# Patient Record
Sex: Male | Born: 1995 | Hispanic: Yes | Marital: Single | Smoking: Never smoker
Health system: Southern US, Community
[De-identification: ages and names within clinical notes are randomized; demographics above are authoritative.]

## PROBLEM LIST (undated history)

## (undated) DIAGNOSIS — J45909 Unspecified asthma, uncomplicated: Secondary | ICD-10-CM

---
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
## 2016-01-06 ENCOUNTER — Encounter: Payer: Self-pay | Admitting: Emergency Medicine

## 2016-01-06 ENCOUNTER — Emergency Department
Admission: EM | Admit: 2016-01-06 | Discharge: 2016-01-06 | Disposition: A | Discharge status: Home / Self Care | Payer: No Typology Code available for payment source | Attending: Emergency Medicine | Admitting: Emergency Medicine

## 2016-01-06 DIAGNOSIS — T310 Burns involving less than 10% of body surface: Secondary | ICD-10-CM | POA: Insufficient documentation

## 2016-01-06 DIAGNOSIS — Y990 Civilian activity done for income or pay: Secondary | ICD-10-CM | POA: Insufficient documentation

## 2016-01-06 DIAGNOSIS — Y929 Unspecified place or not applicable: Secondary | ICD-10-CM | POA: Diagnosis not present

## 2016-01-06 DIAGNOSIS — Y939 Activity, unspecified: Secondary | ICD-10-CM | POA: Diagnosis not present

## 2016-01-06 DIAGNOSIS — X19XXXA Contact with other heat and hot substances, initial encounter: Secondary | ICD-10-CM | POA: Insufficient documentation

## 2016-01-06 DIAGNOSIS — T22211A Burn of second degree of right forearm, initial encounter: Secondary | ICD-10-CM | POA: Insufficient documentation

## 2016-01-06 MED ORDER — CEPHALEXIN 500 MG PO CAPS: 500.0000 mg | ORAL_CAPSULE | Freq: Three times a day (TID) | ORAL | Status: DC

## 2016-01-06 MED ORDER — SILVER SULFADIAZINE 1 % EX CREA
TOPICAL_CREAM | Freq: Once | CUTANEOUS | Status: AC
  Administered 2016-01-06: 10:00:00 via TOPICAL

## 2016-01-06 MED ORDER — SILVER SULFADIAZINE 1 % EX CREA: TOPICAL_CREAM | CUTANEOUS | Status: DC

## 2016-01-06 NOTE — ED Notes (Signed)
Right forearm area cleansed with saline   SSD cream and adaptic /confrom dressing applied

## 2016-01-06 NOTE — ED Notes (Signed)
States he received a burn ro right wrist area 2 days ago ..developed blistered area yesterday and now has some swelling and blister "popped" this am

## 2016-01-06 NOTE — ED Notes (Signed)
Burn to right arm from hot pan yesterday while at work at [employer].

## 2016-01-06 NOTE — Discharge Instructions (Signed)
Burn Care °Your skin is a natural barrier to infection. It is the largest organ of your body. Burns damage this natural protection. To help prevent infection, it is very important to follow your caregiver's instructions in the care of your burn. °Burns are classified as: °· First degree. There is only redness of the skin (erythema). No scarring is expected. °· Second degree. There is blistering of the skin. Scarring may occur with deeper burns. °· Third degree. All layers of the skin are injured, and scarring is expected. °HOME CARE INSTRUCTIONS  °· Wash your hands well before changing your bandage. °· Change your bandage as often as directed by your caregiver. °· Remove the old bandage. If the bandage sticks, you may soak it off with cool, clean water. °· Cleanse the burn thoroughly but gently with mild soap and water. °· Pat the area dry with a clean, dry cloth. °· Apply a thin layer of antibacterial cream to the burn. °· Apply a clean bandage as instructed by your caregiver. °· Keep the bandage as clean and dry as possible. °· Elevate the affected area for the first 24 hours, then as instructed by your caregiver. °· Only take over-the-counter or prescription medicines for pain, discomfort, or fever as directed by your caregiver. °SEEK IMMEDIATE MEDICAL CARE IF:  °· You develop excessive pain. °· You develop redness, tenderness, swelling, or red streaks near the burn. °· The burned area develops yellowish-white fluid (pus) or a bad smell. °· You have a fever. °MAKE SURE YOU:  °· Understand these instructions. °· Will watch your condition. °· Will get help right away if you are not doing well or get worse. °  °This information is not intended to replace advice given to you by your health care provider. Make sure you discuss any questions you have with your health care provider. °  °Document Released: 10/05/2005 Document Revised: 12/28/2011 Document Reviewed: 02/25/2011 °Elsevier Interactive Patient Education ©2016  [employer] ° °Second-Degree Burn °A second-degree burn affects the 2 outer layers of skin. The outer layer (epidermis) and the layer underneath it (dermis) are both burned. Another name for this type of burn is a partial thickness burn. A second-degree burn may be called minor or major. This depends on the size of the burn. It also depends on what parts of the skin are burned. Minor burns may be treated with first aid. Major burns are a medical emergency. °A second-degree burn is worse than a first-degree burn, but not as bad as a third-degree burn. A first-degree burn affects only the epidermis. A third-degree burn goes through all the layers of skin. A second-degree burn usually heals in 3 to 4 weeks. A minor second-degree burn usually does not leave a scar. Deeper second-degree burns may lead to scarring of the skin or contractures over joints. Contractures are scars that form over joints and may lead to reduced mobility at those joints. °CAUSES °· Heat (thermal) injury. This happens when skin comes in contact with something very hot. It could be a flame, a hot object, hot liquid, or steam. Most second-degree burns are thermal injuries. °· Radiation. Sunlight is one type of radiation that can burn the skin. Another type of radiation is used to heat food. Radiation is also used to treat some diseases, such as cancer. All types of radiation can burn the skin. Sunlight usually causes a first-degree burn. Radiation used for heating food or treating a disease can cause a second-degree burn. °· Electricity. Electrical burns can cause more   damage under the skin than on the surface. They should always be treated as major burns. °· Chemicals. Many chemicals can burn the skin. The burn should be flushed with cool water and checked by an emergency caregiver. °SYMPTOMS °Symptoms of second-degree burns include: °· Severe pain. °· Extreme tenderness. °· Deep redness. °· Blistered skin. °· Skin that has changed color. It  might look blotchy, wet, or shiny. °· Swelling. °TREATMENT °Some second-degree burns may need to be treated in a hospital. These include major burns, electrical burns, and chemical burns. Many other second-degree burns can be treated with regular first aid, such as: °· Cooling the burn. Use cool, germ-free (sterile) salt water. Place the burned area of skin into a tub of water, or cover the burned area with clean, wet towels. °· Taking pain medicine. °· Removing the dead skin from broken blisters. A trained caregiver may do this. Do not pop blisters. °· Gently washing your skin with mild soap. °· Covering the burned area with a cream. Silver sulfadiazine is a cream for burns. An antibiotic cream, such as bacitracin, may also be used to fight infection. Do not use other ointments or creams unless your caregiver says it is okay. °· Protecting the burn with a sterile, non-sticky bandage. °· Bandaging fingers and toes separately. This keeps them from sticking together. °· Taking an antibiotic. This can help prevent infection. °· Getting a tetanus shot. °HOME CARE INSTRUCTIONS °Medication °· Take any medicine prescribed by your caregiver. Follow the directions carefully. °· Ask your caregiver if you can take over-the-counter medicine to relieve pain and swelling. Do not give aspirin to children. °· Make sure your caregiver knows about all other medicines you take. This includes over-the-counter medicines. °Burn care °· You will need to change the bandage on your burn. You may need to do this 2 or 3 times each day. °¨ Gently clean the burned area. °¨ Put ointment on it. °¨ Cover the burn with a sterile bandage. °· For some deeper burns or burns that cover a large area, compression garments may be prescribed. These garments can help minimize scarring and protect your mobility. °· Do not put butter or oil on your skin. Use only the cream prescribed by your caregiver. °· Do not put ice on your burn. °· Do not break blisters  on your skin. °· Keep the bandaged area dry. You might need to take a sponge bath for awhile. Ask your caregiver when you can take a shower or a tub bath again. °· Do not scratch an itchy burn. Your caregiver may give you medicine to relieve very bad itching. °· Infection is a big danger after a second-degree burn. Tell your caregiver right away if you have signs of infection, such as: °¨ Redness or changing color in the burned area. °¨ Fluid leaking from the burn. °¨ Swelling in the burn area. °¨ A bad smell coming from the wound. °Follow-up °· Keep all follow-up appointments. This is important. This is how your caregiver can tell if your treatment is working. °· Protect your burn from sunlight. Use sunscreen whenever you go outside. Burned areas may be sensitive to the sun for up to 1 year. Exposure to the sun may also cause permanent darkening of scars. °SEEK MEDICAL CARE IF: °· You have any questions about medicines. °· You have any questions about your treatment. °· You wonder if it is okay to do a particular activity. °· You develop a fever of more than 100.5° F (38.1° C). °SEEK IMMEDIATE MEDICAL CARE IF: °·   You think your burn might be infected. It may change color, become red, leak fluid, swell, or smell bad. °· You develop a fever of more than 102° F (38.9° C). °  °This information is not intended to replace advice given to you by your health care provider. Make sure you discuss any questions you have with your health care provider. °  °Document Released: 03/09/2011 Document Revised: 12/28/2011 Document Reviewed: 03/09/2011 °Elsevier Interactive Patient Education ©2016 Elsevier Inc. ° °

## 2016-01-06 NOTE — ED Provider Notes (Signed)
[facility] Emergency Department Provider Note  ____________________________________________  Time seen: Approximately 9:43 AM  I have reviewed the triage vital signs and the nursing notes.   HISTORY  Chief Complaint Burn  Patient is fluent in English  HPI Bobby Mendoza is a 20 y.o. male , NAD, presents to the emergency department with 2 day history of burn to the right forearm. States he was burned by hot pan while working at [employer]. They applied burn cream and wrap the area and gauze at the time of injury. Patient has not changed the bandages since that time but has noticed that the blister around the area has popped. Denies any fever, chills, body aches. Has not had any oozing or weeping from the site. Area is painful to manipulation. Denies any numbness, weakness, tingling of the right upper extremity. Has not had any other injuries.   History reviewed. No pertinent past medical history.  There are no active problems to display for this patient.   History reviewed. No pertinent past surgical history.  Current Outpatient Rx  Name  Route  Sig  Dispense  Refill  . cephALEXin (KEFLEX) 500 MG capsule   Oral   Take 1 capsule (500 mg total) by mouth 3 (three) times daily.   21 capsule   0   . silver sulfADIAZINE (SILVADENE) 1 % cream      Apply to affected twice daily with bandage change.   50 g   1     Allergies Review of patient's allergies indicates no known allergies.  No family history on file.  Social History Social History  Substance Use Topics  . Smoking status: Never Smoker   . Smokeless tobacco: None  . Alcohol Use: No     Review of Systems  Constitutional: No fever/chills, fatigue Cardiovascular: No chest pain. Respiratory: No shortness of breath. No wheezing.  Gastrointestinal: No abdominal pain.  No nausea, vomiting.   Musculoskeletal: Negative for right arm pain.  Skin: Positive burn to right forearm with blister.  Negative for rash, spreading erythema, oozing, weeping. Neurological: Negative for headaches, focal weakness or numbness. 10-point ROS otherwise negative.  ____________________________________________   PHYSICAL EXAM:  VITAL SIGNS: ED Triage Vitals  Enc Vitals Group     BP 01/06/16 0919 129/70 mmHg     Pulse Rate 01/06/16 0919 77     Resp 01/06/16 0919 20     Temp 01/06/16 0919 97.5 F (36.4 C)     Temp Source 01/06/16 0919 Oral     SpO2 01/06/16 0919 98 %     Weight 01/06/16 0919 205 lb (92.987 kg)     Height 01/06/16 0919  (1.702 m)     Head Cir --      Peak Flow --      Pain Score 01/06/16 0859 5     Pain Loc --      Pain Edu? --      Excl. in GC? --     Constitutional: Alert and oriented. Well appearing and in no acute distress. Eyes: Conjunctivae are normal.   Head: Atraumatic. Neck: Supple with full range of motion. Hematological/Lymphatic/Immunilogical: No cervical lymphadenopathy. Cardiovascular:  Good peripheral circulation with bilateral upper extremities with 2+ pulses. Respiratory: Normal respiratory effort without tachypnea or retractions.  Musculoskeletal: No pain to palpation about the right upper extremity except at the burn site on the forearm. Full range of motion of right shoulder, elbow, wrist, hand, fingers. No edema.  No joint effusions.  Neurologic:  Normal speech and language. No gross focal neurologic deficits are appreciated.  Skin:  2 cm oblong burn with broken blister noted about the posterior, proximal forearm. No oozing or weeping. No skin sores noted. No spreading erythema nor streaking. Skin is warm, dry.  Psychiatric: Mood and affect are normal. Speech and behavior are normal. Patient exhibits appropriate insight and judgement.   ____________________________________________    LABS  None  ____________________________________________  EKG  None ____________________________________________  RADIOLOGY  None ____________________________________________    PROCEDURES  Procedure(s) performed: None    Medications  silver sulfADIAZINE (SILVADENE) 1 % cream (not administered)     ____________________________________________   INITIAL IMPRESSION / ASSESSMENT AND PLAN / ED COURSE  Patient's diagnosis is consistent with second-degree burn of the right forearm. Patient will be discharged home with prescriptions for Silvadene cream to apply twice daily as directed. Patient also empirically covered for any potential infection with Keflex to take as directed. Patient may take over-the-counter Tylenol or ibuprofen as needed for pain. Patient requested a note to excuse from classes today and it was given. [facility] days for a wound recheck. Patient is given ED precautions to return to the ED for any worsening or new symptoms.    ____________________________________________  FINAL CLINICAL IMPRESSION(S) / ED DIAGNOSES  Final diagnoses:  Second degree burn of forearm, right, initial encounter      NEW MEDICATIONS STARTED DURING THIS VISIT:  New Prescriptions   CEPHALEXIN (KEFLEX) 500 MG CAPSULE    Take 1 capsule (500 mg total) by mouth 3 (three) times daily.   SILVER SULFADIAZINE (SILVADENE) 1 % CREAM    Apply to affected twice daily with bandage change.         Hope PigeonJami L Hagler, PA-C 01/06/16 [id number]  Governor Rooksebecca Lord, MD 01/06/16 1017

## 2018-05-02 ENCOUNTER — Emergency Department
Admission: EM | Admit: 2018-05-02 | Discharge: 2018-05-02 | Disposition: A | Discharge status: Home / Self Care | Payer: Self-pay | Attending: Emergency Medicine | Admitting: Emergency Medicine

## 2018-05-02 ENCOUNTER — Other Ambulatory Visit: Payer: Self-pay

## 2018-05-02 ENCOUNTER — Encounter: Payer: Self-pay | Admitting: Emergency Medicine

## 2018-05-02 DIAGNOSIS — K529 Noninfective gastroenteritis and colitis, unspecified: Secondary | ICD-10-CM | POA: Insufficient documentation

## 2018-05-02 LAB — URINALYSIS, COMPLETE (UACMP) WITH MICROSCOPIC
BACTERIA UA: NONE SEEN
Glucose, UA: NEGATIVE mg/dL
HGB URINE DIPSTICK: NEGATIVE
KETONES UR: NEGATIVE mg/dL
Leukocytes, UA: NEGATIVE
Nitrite: NEGATIVE
PROTEIN: NEGATIVE mg/dL
SQUAMOUS EPITHELIAL / LPF: NONE SEEN (ref 0–5)
Specific Gravity, Urine: 1.033 — ABNORMAL HIGH (ref 1.005–1.030)
pH: 5 (ref 5.0–8.0)

## 2018-05-02 LAB — CBC
HEMATOCRIT: 43.2 % (ref 40.0–52.0)
Hemoglobin: 15.1 g/dL (ref 13.0–18.0)
MCH: 29.9 pg (ref 26.0–34.0)
MCHC: 34.9 g/dL (ref 32.0–36.0)
MCV: 85.6 fL (ref 80.0–100.0)
Platelets: 241 10*3/uL (ref 150–440)
RBC: 5.04 MIL/uL (ref 4.40–5.90)
RDW: 12.7 % (ref 11.5–14.5)
WBC: 10 10*3/uL (ref 3.8–10.6)

## 2018-05-02 LAB — COMPREHENSIVE METABOLIC PANEL
ALT: 20 U/L (ref 0–44)
AST: 30 U/L (ref 15–41)
Albumin: 4.7 g/dL (ref 3.5–5.0)
Alkaline Phosphatase: 94 U/L (ref 38–126)
Anion gap: 11 (ref 5–15)
BUN: 14 mg/dL (ref 6–20)
CHLORIDE: 106 mmol/L (ref 98–111)
CO2: 22 mmol/L (ref 22–32)
Calcium: 9 mg/dL (ref 8.9–10.3)
Creatinine, Ser: 0.82 mg/dL (ref 0.61–1.24)
GFR calc Af Amer: >60 mL/min (ref >60)
Glucose, Bld: 94 mg/dL (ref 70–99)
POTASSIUM: 3.5 mmol/L (ref 3.5–5.1)
SODIUM: 139 mmol/L (ref 135–145)
Total Bilirubin: 0.7 mg/dL (ref 0.3–1.2)
Total Protein: 7.8 g/dL (ref 6.5–8.1)

## 2018-05-02 LAB — LIPASE, BLOOD: LIPASE: 27 U/L (ref 11–51)

## 2018-05-02 MED ORDER — LOPERAMIDE HCL 2 MG PO TABS: 2.0000 mg | ORAL_TABLET | Freq: Four times a day (QID) | ORAL | 0 refills | Status: DC | PRN

## 2018-05-02 MED ORDER — ONDANSETRON 4 MG PO TBDP: 4.0000 mg | ORAL_TABLET | Freq: Three times a day (TID) | ORAL | 0 refills | Status: DC | PRN

## 2018-05-02 NOTE — ED Provider Notes (Signed)
[facility] Emergency Department Provider Note   ____________________________________________    I have reviewed the triage vital signs and the nursing notes.   HISTORY  Chief Complaint Abdominal Pain and Diarrhea     HPI Bobby Mendoza is a 22 y.o. male who presents with complaints of diarrhea and nausea which started late last night.  Patient reports multiple episodes of watery diarrhea last night.  Some abdominal cramping as well.  Today he feels nauseated.  No recent travel.  No recent camping.  No recent medications.  No recent box.  No sick contacts reported.  Has not taken anything for this.  No fevers or chills   History reviewed. No pertinent past medical history.  There are no active problems to display for this patient.   History reviewed. No pertinent surgical history.  Prior to Admission medications   Medication Sig Start Date End Date Taking? Authorizing Provider  cephALEXin (KEFLEX) 500 MG capsule Take 1 capsule (500 mg total) by mouth 3 (three) times daily. 01/06/16   Hagler, Jami L, PA-C  loperamide (IMODIUM A-D) 2 MG tablet Take 1 tablet (2 mg total) by mouth 4 (four) times daily as needed for diarrhea or loose stools. 05/02/18   Jene Every, MD  ondansetron (ZOFRAN ODT) 4 MG disintegrating tablet Take 1 tablet (4 mg total) by mouth every 8 (eight) hours as needed for nausea or vomiting. 05/02/18   Jene Every, MD  silver sulfADIAZINE (SILVADENE) 1 % cream Apply to affected twice daily with bandage change. 01/06/16   Hagler, Jami L, PA-C     Allergies Patient has no known allergies.  No family history on file.  Social History Social History   Tobacco Use  . Smoking status: Never Smoker  . Smokeless tobacco: Never Used  Substance Use Topics  . Alcohol use: No  . Drug use: Not on file    Review of Systems  Constitutional: No fever/chills Eyes: No visual changes.  ENT: No sore throat. Cardiovascular: Denies chest  pain. Respiratory: Denies shortness of breath. Gastrointestinal: As above Genitourinary: Negative for dysuria. Musculoskeletal: No myalgias Skin: Negative for rash. Neurological: Negative for headaches or weakness   ____________________________________________   PHYSICAL EXAM:  VITAL SIGNS: ED Triage Vitals  Enc Vitals Group     BP 05/02/18 0938 123/66     Pulse Rate 05/02/18 0938 91     Resp 05/02/18 0938 16     Temp 05/02/18 0938 99.2 F (37.3 C)     Temp Source 05/02/18 0938 Oral     SpO2 05/02/18 0938 95 %     Weight 05/02/18 0937 94.3 kg (208 lb)     Height 05/02/18 0937 1.702 m (5\' 7" )     Head Circumference --      Peak Flow --      Pain Score 05/02/18 0937 4     Pain Loc --      Pain Edu? --      Excl. in GC? --     Constitutional: Alert and oriented. No acute distress. Pleasant and interactive  Nose: No congestion/rhinnorhea. Mouth/Throat: Mucous membranes are moist.    Cardiovascular: Normal rate, regular rhythm. Grossly normal heart sounds.  Good peripheral circulation. Respiratory: Normal respiratory effort.  No retractions. Lungs CTAB. Gastrointestinal: Soft and nontender. No distention.  No CVA tenderness.   Musculoskeletal: No lower extremity tenderness nor edema.  Warm and well perfused Neurologic:  Normal speech and language. No gross focal neurologic deficits are  appreciated.  Skin:  Skin is warm, dry and intact. No rash noted. Psychiatric: Mood and affect are normal. Speech and behavior are normal.  ____________________________________________   LABS (all labs ordered are listed, but only abnormal results are displayed)  Labs Reviewed  URINALYSIS, COMPLETE (UACMP) WITH MICROSCOPIC - Abnormal; Notable for the following components:      Result Value   Color, Urine YELLOW (*)    APPearance CLEAR (*)    Specific Gravity, Urine 1.033 (*)    Bilirubin Urine SMALL (*)    All other components within normal limits  LIPASE, BLOOD  COMPREHENSIVE  METABOLIC PANEL  CBC   ____________________________________________  EKG   ____________________________________________  RADIOLOGY  None ____________________________________________   PROCEDURES  Procedure(s) performed: No  Procedures   Critical Care performed: No ____________________________________________   INITIAL IMPRESSION / ASSESSMENT AND PLAN / ED COURSE  Pertinent labs & imaging results that were available during my care of the patient were reviewed by me and considered in my medical decision making (see chart for details).  Patient well-appearing and in no acute distress.  Abdominal exam is completely reassuring.  Lab work is unremarkable.  No indication for IV fluids or further work-up at this time, recommend supportive care for likely viral gastroenteritis    ____________________________________________   FINAL CLINICAL IMPRESSION(S) / ED DIAGNOSES  Final diagnoses:  Gastroenteritis        Note:  This document was prepared using Dragon voice recognition software and may include unintentional dictation errors.    Jene EveryKinner, Dakota Stangl, MD 05/02/18 1416

## 2018-05-02 NOTE — ED Notes (Signed)
Pt discharged home after verbalizing understanding of discharge instructions; nad noted. 

## 2018-05-02 NOTE — ED Triage Notes (Signed)
C/O abdominal cramping and diarrhea, onset yesterday.

## 2018-05-02 NOTE — ED Notes (Signed)
Pt reports that he woke up tired this morning and has had stomach cramping and diarrhea since last night. Pt states he could not stay at work due to the cramping. Pt alert & oriented with NADnoted.

## 2019-07-24 ENCOUNTER — Emergency Department: Payer: Self-pay

## 2019-07-24 ENCOUNTER — Other Ambulatory Visit: Payer: Self-pay

## 2019-07-24 ENCOUNTER — Emergency Department
Admission: EM | Admit: 2019-07-24 | Discharge: 2019-07-24 | Disposition: A | Discharge status: Home / Self Care | Payer: Self-pay | Attending: Emergency Medicine | Admitting: Emergency Medicine

## 2019-07-24 DIAGNOSIS — Z20828 Contact with and (suspected) exposure to other viral communicable diseases: Secondary | ICD-10-CM | POA: Insufficient documentation

## 2019-07-24 DIAGNOSIS — J45909 Unspecified asthma, uncomplicated: Secondary | ICD-10-CM | POA: Insufficient documentation

## 2019-07-24 DIAGNOSIS — J069 Acute upper respiratory infection, unspecified: Secondary | ICD-10-CM | POA: Insufficient documentation

## 2019-07-24 DIAGNOSIS — Z8709 Personal history of other diseases of the respiratory system: Secondary | ICD-10-CM

## 2019-07-24 HISTORY — DX: Unspecified asthma, uncomplicated: J45.909

## 2019-07-24 LAB — SARS CORONAVIRUS 2 (TAT 6-24 HRS): SARS Coronavirus 2: NEGATIVE

## 2019-07-24 MED ORDER — ALBUTEROL SULFATE HFA 108 (90 BASE) MCG/ACT IN AERS: 2.0000 | INHALATION_SPRAY | Freq: Four times a day (QID) | RESPIRATORY_TRACT | 0 refills | Status: AC | PRN

## 2019-07-24 NOTE — Discharge Instructions (Addendum)
Return to the emergency department if any severe worsening of your symptoms or difficulty breathing.  Tylenol as needed for fever.  Increase fluids.  You and your family will need to quarantine until you have heard the results of your test.

## 2019-07-24 NOTE — ED Notes (Signed)
See triage note   Presents with cough and wheezing  States he has a hx of asthma   But has not used an inhaler in several years  Afebrile on arrival

## 2019-07-24 NOTE — ED Provider Notes (Addendum)
[facility] Emergency Department Provider Note  ____________________________________________   None    (approximate)  I have reviewed the triage vital signs and the nursing notes.   HISTORY  Chief Complaint Cough and Nasal Congestion   HPI Bobby Mendoza is a 23 y.o. male presents to the ED with family complaining of cough and wheezing.  He is unaware of any fever.  There is been some upper respiratory congestion.  Patient states he has a history of asthma as a child.  He states he has not used an inhaler since he was 23 years old.  He has noticed some wheezing occasionally with his coughing.  Patient's  spouse reports that children were in a daycare where there was positive COVID with the workers at the daycare.  Daycare is now closed.    Past Medical History:  Diagnosis Date  . Asthma     There are no active problems to display for this patient.   History reviewed. No pertinent surgical history.  Prior to Admission medications   Medication Sig Start Date End Date Taking? Authorizing Provider  albuterol (VENTOLIN HFA) 108 (90 Base) MCG/ACT inhaler Inhale 2 puffs into the lungs every 6 (six) hours as needed for wheezing or shortness of breath. 07/24/19   Johnn Hai, PA-C    Allergies Patient has no known allergies.  No family history on file.  Social History Social History   Tobacco Use  . Smoking status: Never Smoker  . Smokeless tobacco: Never Used  Substance Use Topics  . Alcohol use: No  . Drug use: Not on file    Review of Systems Constitutional: No fever/chills Eyes: No visual changes. ENT: No sore throat.  Nasal congestion positive. Cardiovascular: Denies chest pain. Respiratory: Denies shortness of breath.  Positive cough and wheezing. Gastrointestinal: No abdominal pain.  No nausea, no vomiting.  No diarrhea.  Musculoskeletal: Negative for back pain. Skin: Negative for rash. Neurological: Negative for headaches,  focal weakness or numbness. ____________________________________________   PHYSICAL EXAM:  VITAL SIGNS: ED Triage Vitals  Enc Vitals Group     BP 07/24/19 0628 128/70     Pulse Rate 07/24/19 0628 75     Resp 07/24/19 0628 18     Temp 07/24/19 0628 98.1 F (36.7 C)     Temp src --      SpO2 07/24/19 0628 96 %     Weight 07/24/19 0626 220 lb (99.8 kg)     Height 07/24/19 0626 5\' 6"  (1.676 m)     Head Circumference --      Peak Flow --      Pain Score 07/24/19 0628 0     Pain Loc --      Pain Edu? --      Excl. in [place]? --    Constitutional: Alert and oriented. Well appearing and in no acute distress. Eyes: Conjunctivae are normal.  Head: Atraumatic. Nose: Positive congestion/rhinnorhea. Mouth/Throat: Mucous membranes are moist.  Oropharynx non-erythematous. Neck: No stridor.   Cardiovascular: Normal rate, regular rhythm. Grossly normal heart sounds.  Good peripheral circulation. Respiratory: Normal respiratory effort.  No retractions. Lungs rare occasional expiratory wheeze heard bilaterally. Musculoskeletal: Moves upper and lower extremities without any difficulty.  Normal gait was noted. Neurologic:  Normal speech and language. No gross focal neurologic deficits are appreciated. No gait instability. Skin:  Skin is warm, dry and intact. No rash noted. Psychiatric: Mood and affect are normal. Speech and behavior are normal.  ____________________________________________  LABS (all labs ordered are listed, but only abnormal results are displayed)  Labs Reviewed  SARS CORONAVIRUS 2 (TAT 6-24 HRS)   ___________________________________________  RADIOLOGY  Official radiology report(s): Dg Chest 2 View  Result Date: 07/24/2019 CLINICAL DATA:  Cough. EXAM: CHEST - 2 VIEW COMPARISON:  None. FINDINGS: The heart size and mediastinal contours are within normal limits. Both lungs are clear. The visualized skeletal structures are unremarkable. IMPRESSION: No active cardiopulmonary  disease. Electronically Signed   By: Lupita Raider M.D.   On: 07/24/2019 07:34    ____________________________________________   PROCEDURES  Procedure(s) performed (including Critical Care):  Procedures   ____________________________________________   INITIAL IMPRESSION / ASSESSMENT AND PLAN / ED COURSE  As part of my medical decision making, I reviewed the following data within the electronic MEDICAL RECORD NUMBER Notes from prior ED visits and [place] Controlled Substance Database  23 year old male presents to the ED with URI symptoms.  Children were exposed to positive COVID while at daycare.  Patient also has a history of asthma but has not used an inhaler since he was 23 years old.  He has heard wheezing on occasion.  Positive low-grade temp.  Covid test was done and patient is aware that he and family are quarantined until the results of this test has been received.  ____________________________________________   FINAL CLINICAL IMPRESSION(S) / ED DIAGNOSES  Final diagnoses:  Viral URI with cough  History of asthma     ED Discharge Orders         Ordered    albuterol (VENTOLIN HFA) 108 (90 Base) MCG/ACT inhaler  Every 6 hours PRN     07/24/19 0831           Note:  This document was prepared using Dragon voice recognition software and may include unintentional dictation errors.    Tommi Rumps, PA-C 07/24/19 0837    Tommi Rumps, PA-C 07/24/19 0998    Chesley Noon, MD 07/24/19 [phone]
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
## 2019-07-25 ENCOUNTER — Telehealth: Payer: Self-pay | Admitting: General Practice

## 2019-07-25 NOTE — Telephone Encounter (Signed)
Negative COVID results given. Patient results "NOT Detected." Caller expressed understanding. ° °

## 2020-05-11 ENCOUNTER — Encounter: Payer: Self-pay | Admitting: Emergency Medicine

## 2020-05-11 ENCOUNTER — Emergency Department: Payer: No Typology Code available for payment source

## 2020-05-11 ENCOUNTER — Other Ambulatory Visit: Payer: Self-pay

## 2020-05-11 ENCOUNTER — Emergency Department
Admission: EM | Admit: 2020-05-11 | Discharge: 2020-05-11 | Disposition: A | Discharge status: Home / Self Care | Payer: No Typology Code available for payment source | Attending: Emergency Medicine | Admitting: Emergency Medicine

## 2020-05-11 DIAGNOSIS — Y92410 Unspecified street and highway as the place of occurrence of the external cause: Secondary | ICD-10-CM | POA: Diagnosis not present

## 2020-05-11 DIAGNOSIS — Y999 Unspecified external cause status: Secondary | ICD-10-CM | POA: Diagnosis not present

## 2020-05-11 DIAGNOSIS — Y939 Activity, unspecified: Secondary | ICD-10-CM | POA: Diagnosis not present

## 2020-05-11 DIAGNOSIS — S39012A Strain of muscle, fascia and tendon of lower back, initial encounter: Secondary | ICD-10-CM | POA: Insufficient documentation

## 2020-05-11 DIAGNOSIS — J45909 Unspecified asthma, uncomplicated: Secondary | ICD-10-CM | POA: Insufficient documentation

## 2020-05-11 DIAGNOSIS — S60512A Abrasion of left hand, initial encounter: Secondary | ICD-10-CM | POA: Insufficient documentation

## 2020-05-11 DIAGNOSIS — S34109A Unspecified injury to unspecified level of lumbar spinal cord, initial encounter: Secondary | ICD-10-CM | POA: Diagnosis present

## 2020-05-11 DIAGNOSIS — Z7951 Long term (current) use of inhaled steroids: Secondary | ICD-10-CM | POA: Insufficient documentation

## 2020-05-11 DIAGNOSIS — S60222A Contusion of left hand, initial encounter: Secondary | ICD-10-CM | POA: Diagnosis not present

## 2020-05-11 DIAGNOSIS — W2210XA Striking against or struck by unspecified automobile airbag, initial encounter: Secondary | ICD-10-CM | POA: Insufficient documentation

## 2020-05-11 MED ORDER — BACITRACIN-NEOMYCIN-POLYMYXIN 400-5-5000 EX OINT
TOPICAL_OINTMENT | Freq: Once | CUTANEOUS | Status: AC
  Administered 2020-05-11: 1 via TOPICAL
  Filled 2020-05-11: qty 1

## 2020-05-11 MED ORDER — IBUPROFEN 600 MG PO TABS: 600.0000 mg | ORAL_TABLET | Freq: Three times a day (TID) | ORAL | 0 refills | Status: AC | PRN
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
## 2020-05-11 NOTE — Discharge Instructions (Signed)
Follow-up with your primary care provider or [facility] acute care if any continued problems.  Ice or heat to your back as needed for discomfort.  Watch your left hand for any signs of infection.  Keep it clean and dry.  You may also apply a thin layer of Neosporin to the area for 1 to 2 days.  Ibuprofen was sent to your pharmacy.  Take this with food to help with soreness and stiffness.  You will be sore and stiff for approximately 4 to 5 days even with medication.
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
